# Patient Record
Sex: Female | Born: 1975 | Hispanic: Yes | Smoking: Never smoker
Health system: Southern US, Community
[De-identification: ages and names within clinical notes are randomized; demographics above are authoritative.]

## PROBLEM LIST (undated history)

## (undated) DIAGNOSIS — D649 Anemia, unspecified: Secondary | ICD-10-CM

## (undated) DIAGNOSIS — Z789 Other specified health status: Secondary | ICD-10-CM

## (undated) HISTORY — PX: NO PAST SURGERIES: SHX2092

---

## 2005-08-21 ENCOUNTER — Ambulatory Visit (HOSPITAL_COMMUNITY): Admission: RE | Admit: 2005-08-21 | Discharge: 2005-08-21 | Payer: Self-pay | Admitting: Internal Medicine

## 2005-11-09 ENCOUNTER — Ambulatory Visit: Payer: Self-pay | Admitting: Certified Nurse Midwife

## 2005-11-09 ENCOUNTER — Inpatient Hospital Stay (HOSPITAL_COMMUNITY): Admission: AD | Admit: 2005-11-09 | Discharge: 2005-11-10 | Payer: Self-pay | Admitting: Obstetrics & Gynecology

## 2005-11-20 ENCOUNTER — Inpatient Hospital Stay (HOSPITAL_COMMUNITY): Admission: AD | Admit: 2005-11-20 | Discharge: 2005-11-21 | Payer: Self-pay | Admitting: Gynecology

## 2006-02-25 ENCOUNTER — Inpatient Hospital Stay (HOSPITAL_COMMUNITY): Admission: AD | Admit: 2006-02-25 | Discharge: 2006-02-27 | Payer: Self-pay | Admitting: Obstetrics

## 2008-01-20 ENCOUNTER — Inpatient Hospital Stay (HOSPITAL_COMMUNITY): Admission: AD | Admit: 2008-01-20 | Discharge: 2008-01-22 | Payer: Self-pay | Admitting: Obstetrics

## 2008-04-22 IMAGING — US US OB COMP LESS 14 WK
1 series · 14 of 20 positions shown · non-contrast
Comparison: none

CLINICAL DATA: Uncertain menstrual dates.  Evaluate gestational age.  Stated LMP of 05/31/05.  
 OBSTETRICAL ULTRASOUND <14 WKS:
TECHNIQUE: Transabdominal ultrasound was performed for evaluation of the gestation as well as the maternal uterus and adnexal regions.

[Series 1: us ob comp less 14 wk · 0.29mm/px · 14 of 20 slices shown]
[im 1/20]
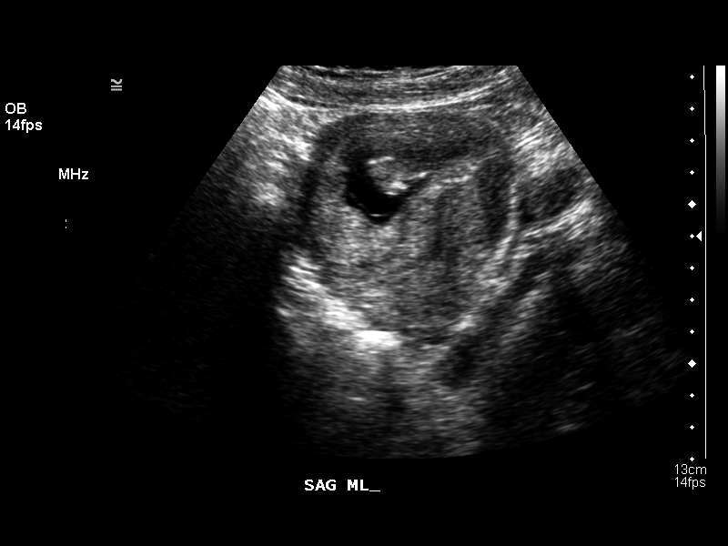
[im 3/20]
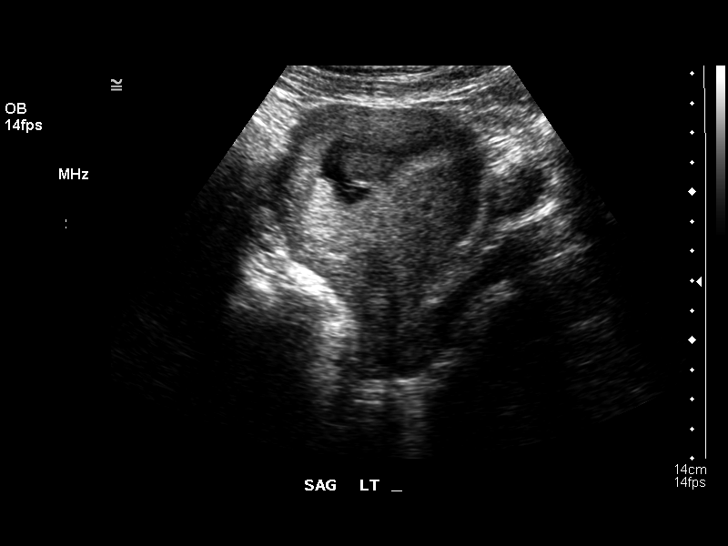
[im 4/20]
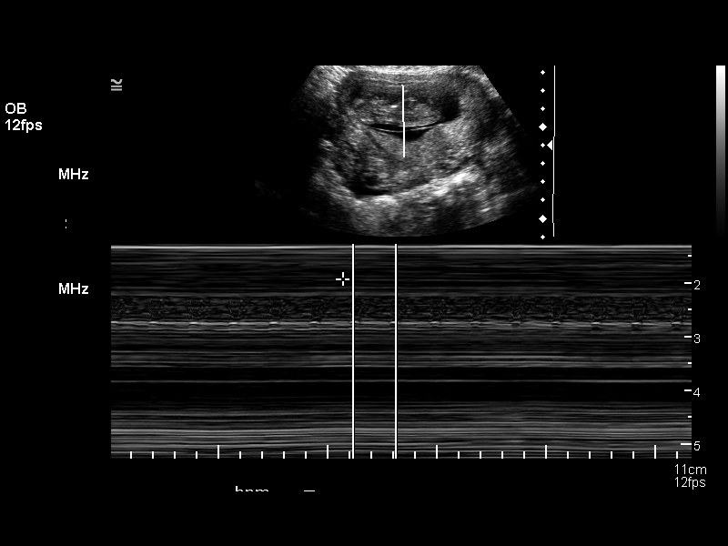
[im 6/20]
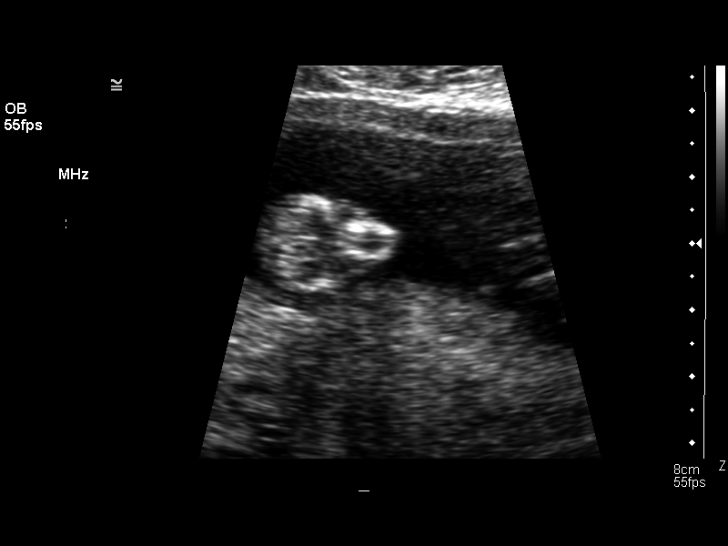
[im 7/20]
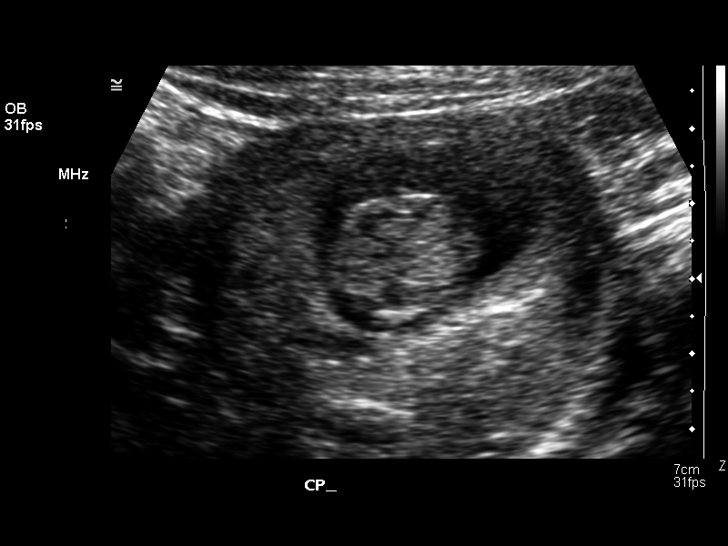
[im 8/20]
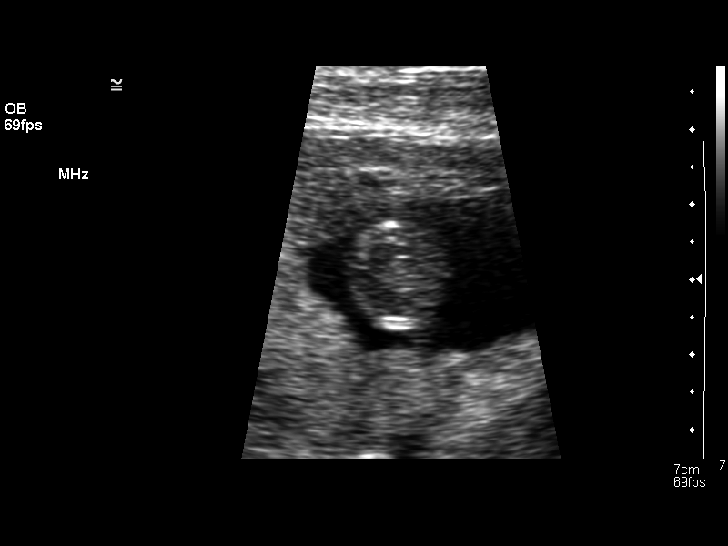
[im 10/20]
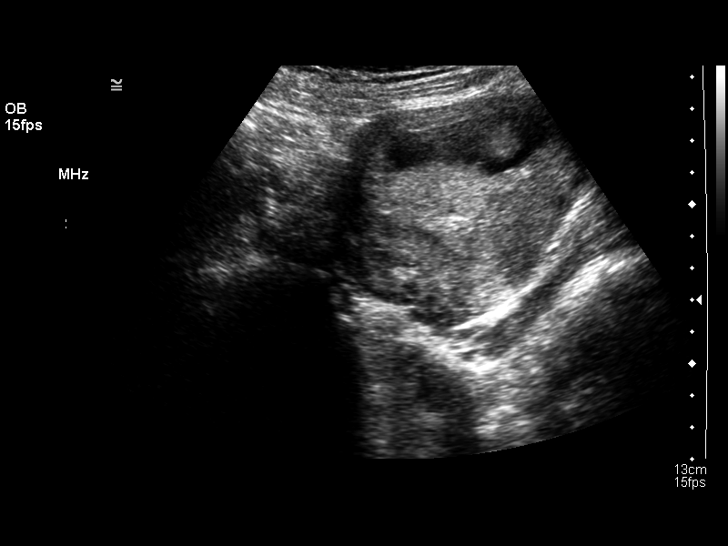
[im 11/20]
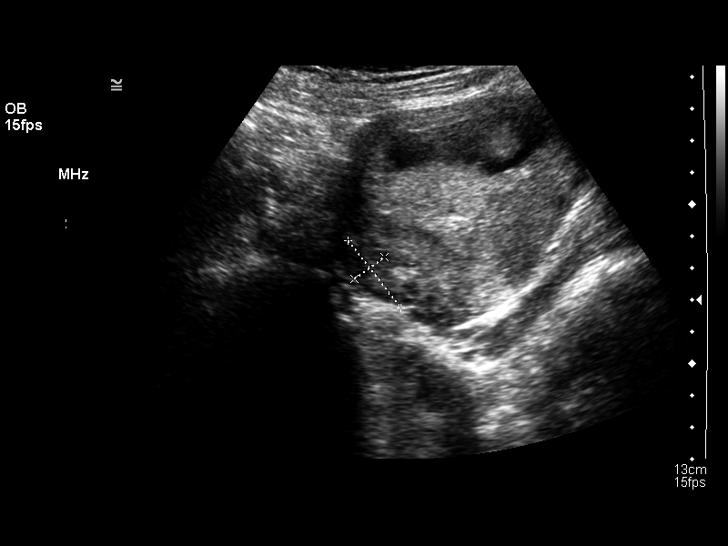
[im 13/20]
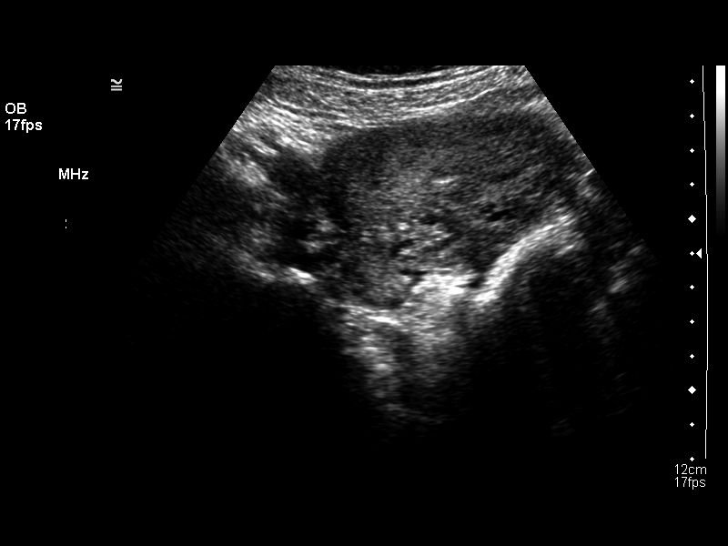
[im 14/20]
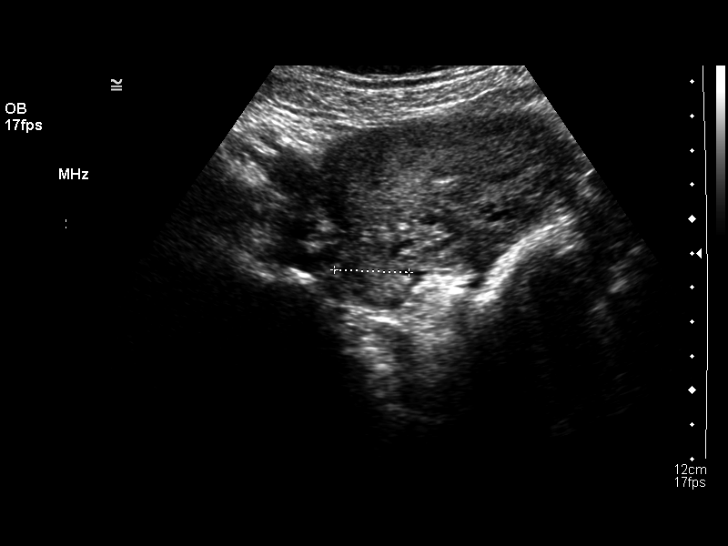
[im 16/20]
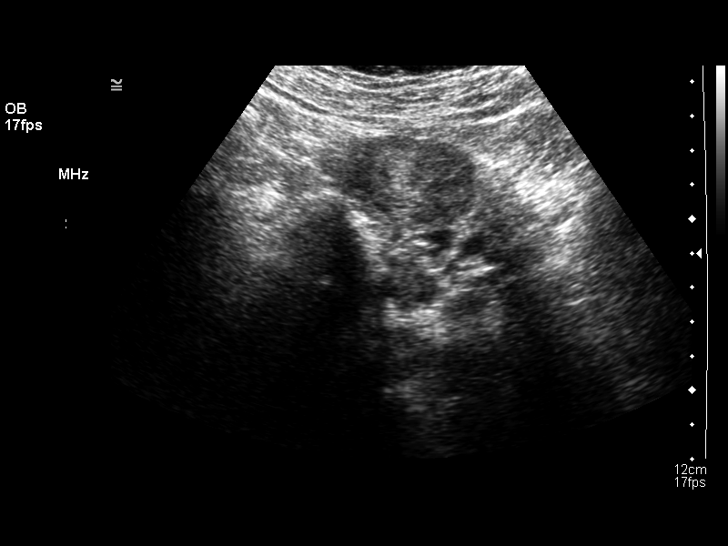
[im 17/20]
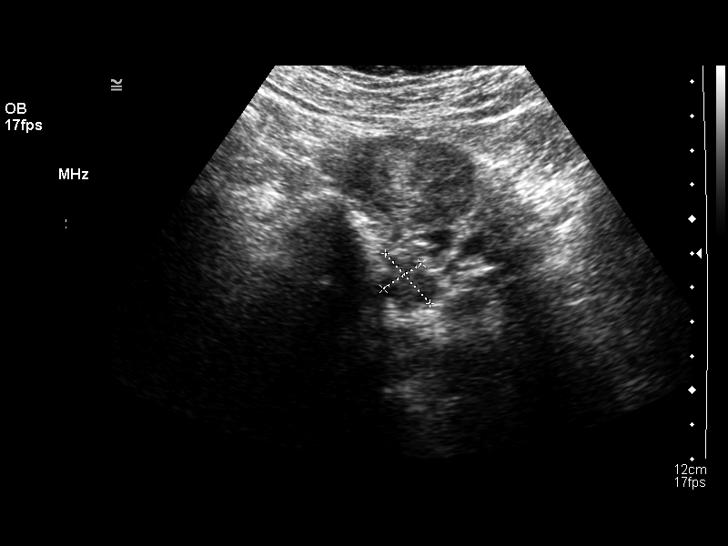
[im 18/20]
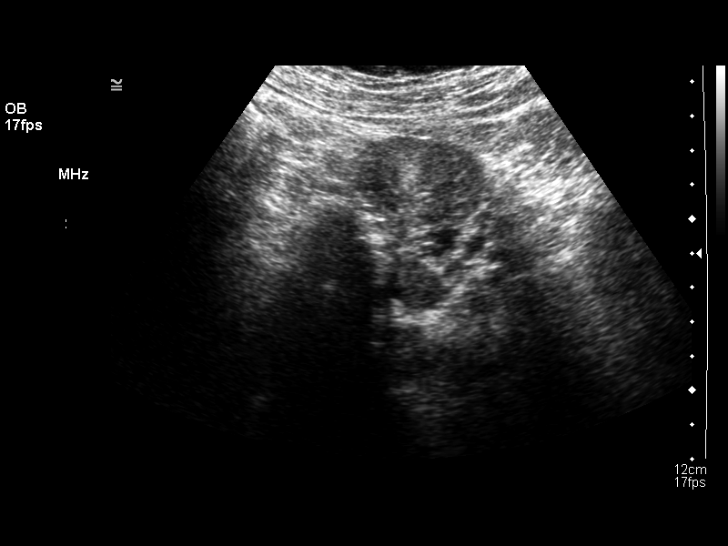
[im 20/20]
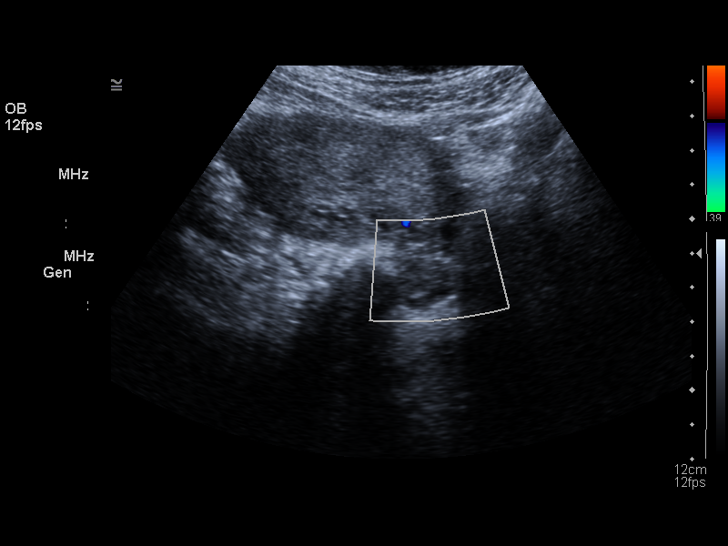

[14 of 20 positions shown; findings below may reference images not displayed]

FINDINGS: A single living intrauterine fetus is seen with measured heart rate of 153.  Fetal movement is noted.  Crown-rump length measures 5.0 cm, corresponding with a gestational age of 11 weeks 5 days.  Amniotic fluid volume appears normal.  There is no evidence of subchorionic hemorrhage.  No fibroids or other uterine abnormalities are identified.  
 Both ovaries are normal in appearance.  There is no evidence of adnexal mass or free fluid.
IMPRESSION: 1.  Single living intrauterine fetus with estimated gestational age of 11 weeks 5 days and sonographic EDC of 03/07/06.  This matches patient?s stated LMP.  
 2.  No maternal uterine or adnexal abnormality identified.

## 2010-02-02 ENCOUNTER — Encounter: Payer: Self-pay | Admitting: Internal Medicine

## 2010-04-28 LAB — CBC
HCT: 32.4 % — ABNORMAL LOW (ref 36.0–46.0)
HCT: 38.7 % (ref 36.0–46.0)
Hemoglobin: 11.1 g/dL — ABNORMAL LOW (ref 12.0–15.0)
MCHC: 34.3 g/dL (ref 30.0–36.0)
MCV: 90.9 fL (ref 78.0–100.0)
Platelets: 292 10*3/uL (ref 150–400)
RBC: 4.25 MIL/uL (ref 3.87–5.11)
RDW: 13.4 % (ref 11.5–15.5)
WBC: 14 10*3/uL — ABNORMAL HIGH (ref 4.0–10.5)

## 2011-06-11 ENCOUNTER — Ambulatory Visit: Payer: Self-pay | Admitting: Physician Assistant

## 2011-06-11 VITALS — BP 118/76 | HR 79 | Temp 98.5°F | Resp 18 | Ht 61.5 in | Wt 175.6 lb

## 2011-06-11 DIAGNOSIS — S61409A Unspecified open wound of unspecified hand, initial encounter: Secondary | ICD-10-CM

## 2011-06-11 DIAGNOSIS — Z23 Encounter for immunization: Secondary | ICD-10-CM

## 2011-06-11 DIAGNOSIS — M79609 Pain in unspecified limb: Secondary | ICD-10-CM

## 2011-06-11 DIAGNOSIS — M79643 Pain in unspecified hand: Secondary | ICD-10-CM

## 2011-06-11 MED ORDER — SULFAMETHOXAZOLE-TRIMETHOPRIM 800-160 MG PO TABS: 1.0000 | ORAL_TABLET | Freq: Two times a day (BID) | ORAL | Status: AC

## 2011-06-11 MED ORDER — HYDROCODONE-ACETAMINOPHEN 5-325 MG PO TABS: 1.0000 | ORAL_TABLET | Freq: Four times a day (QID) | ORAL | Status: AC | PRN

## 2011-06-11 NOTE — Patient Instructions (Signed)
[facility] (Wound Care) Vuelta en 2 dias para chequearse.  Por favor vuelta en 7 das para hacer sus puntadas/ grapas quitar o ms pronto si usted tiene preocupaciones. Shan Levans el rea limpia y seca por 24 horas. No quite el vendaje, si est aplicado. Marland Kitchen Despus de 24 horas, quita el vendaje y limpia la herida suavemente con cualquier tipo de [place] y agua tibia. Reaplique un vendaje nuevo despus de limpiar herida, si est dirigido. . Limpie la herida con el jabn y agua 1 a 2 veces al Holiday representative se quitan las puntadas. . No aplique ninguna ungentos ni cremas a la herida [employer] las puntadas estn en lugar, pues sta puede causar curativo retrasado. . Notifique la oficina si usted tiene el siguiente de los muestras de la infeccin: Hinchazn, enrojecimiento, calor, drenaje del pus, de la fiebre > 101.0 F . Notifique la oficina si usted tiene la sangra excesiva eso no para despus de 15-20 minutos de presin firme y Risk analyst.

## 2011-06-11 NOTE — Progress Notes (Signed)
Patient evaluated and Precepted with Ms. Marte, PA-C and agree.

## 2011-06-11 NOTE — Progress Notes (Signed)
  Subjective:    Patient ID: Sarah Baxter, female    DOB: 1975-02-13, 36 y.o.   MRN: [id number]  HPI Patient presents with puncture wound to left palm. States this morning when she was doing dishes she put her hand in the sink and a knife went into her hand. Admits to pain, swelling, and limited ROM. Tetanus status unknown.     Review of Systems  All other systems reviewed and are negative.       Objective:   Physical Exam  Constitutional: She appears well-developed.  HENT:  Head: Normocephalic and atraumatic.  Right Ear: External ear normal.  Left Ear: External ear normal.  Eyes: Conjunctivae are normal.  Musculoskeletal:       Right shoulder: She exhibits normal strength (normal strength of left flexors).  Skin: Skin is warm and dry.     Psychiatric: She has a normal mood and affect. Her behavior is normal. Judgment and thought content normal.     Verbal consent obtained from patient.  Local anesthesia with 3cc 2% lidocaine plain.  Wound scrubbed with soap and water and rinsed.  Wound closed with 2 4-0 Ethilon SI sutures.  Wound cleansed and dressed.      Assessment & Plan:   1. Need for Tdap vaccination  Tdap vaccine greater than or equal to 7yo IM  2. Pain, hand  HYDROcodone-acetaminophen (NORCO) 5-325 MG per tablet  3. Wound, open, hand with or without fingers  Follow up in 48 hours for recheck, sooner if any erythema, warmth, tenderness, or fevers sulfamethoxazole-trimethoprim (BACTRIM DS,SEPTRA DS) 800-160 MG per tablet

## 2011-06-13 ENCOUNTER — Ambulatory Visit (INDEPENDENT_AMBULATORY_CARE_PROVIDER_SITE_OTHER): Payer: Self-pay | Admitting: Family Medicine

## 2011-06-13 VITALS — BP 114/72 | HR 80 | Temp 98.9°F | Resp 16 | Ht 62.0 in | Wt 176.0 lb

## 2011-06-13 DIAGNOSIS — S61439A Puncture wound without foreign body of unspecified hand, initial encounter: Secondary | ICD-10-CM

## 2011-06-13 DIAGNOSIS — S61409A Unspecified open wound of unspecified hand, initial encounter: Secondary | ICD-10-CM

## 2011-06-13 NOTE — Patient Instructions (Addendum)
Recheck with Rhoderick Moody, PA-C. In 3 days between 9am and 5 pm., sooner if worse. --- advised pt while in office, but left prior to AVS printing.

## 2011-06-13 NOTE — Progress Notes (Signed)
  Subjective:    Patient ID: Sarah Baxter, female    DOB: 1975/02/08, 36 y.o.   MRN: [id number]  HPI Sarah Baxter is a 36 y.o. female  Seen 2 days ago for puncture wound to left palm  - when she was doing dishes she put her hand in the sink and a knife went into her hand.  DOI 06/11/11. wound closed with 2 4-0 Ethilon SI sutures. Tdap given , and started on septra ds.  No fever, no discharge, no increase in pain.  Tolerating antibiotic.  Review of Systems     Objective:   Physical Exam  L hand - palmar aspect with sutures intact.  No surrounding erythema.  Slight sts/fullness of L middle hand, but able to felex/extend phanges, wrist and pronate/supinate wrist without difficulty.        Assessment & Plan:  Puncture Wound - hand.   Stable, tolerating antibiotic.  Continue Septra, avoid repetitive use and recheck with Rhoderick Moody, PA-C in 3 days for status.  Anticipate s.r. In 5-8 days. RTC precautions discussed, and spanish spoken - understanding expressed.

## 2011-06-16 ENCOUNTER — Ambulatory Visit (INDEPENDENT_AMBULATORY_CARE_PROVIDER_SITE_OTHER): Payer: Self-pay | Admitting: Physician Assistant

## 2011-06-16 VITALS — BP 112/72 | HR 74 | Temp 98.7°F | Resp 18 | Wt 177.0 lb

## 2011-06-16 DIAGNOSIS — S61409A Unspecified open wound of unspecified hand, initial encounter: Secondary | ICD-10-CM

## 2011-06-16 NOTE — Progress Notes (Signed)
  Subjective:    Patient ID: Sarah Baxter, female    DOB: May 04, 1975, 36 y.o.   MRN: [id number]  HPI Patient here for suture removal. DOI: 06/11/11. Wound is well healing without erythema, drainage, or tenderness. Tolerating medication.     Review of Systems  All other systems reviewed and are negative.        Objective:   Physical Exam  Constitutional: She is oriented to person, place, and time. She appears well-developed and well-nourished.  HENT:  Head: Normocephalic and atraumatic.  Eyes: Conjunctivae are normal.  Neurological: She is alert and oriented to person, place, and time.  Skin:     Psychiatric: She has a normal mood and affect. Her behavior is normal. Judgment and thought content normal.    #2 sutures removed without difficulty.  Band-aid applied. Patient tolerated procedure well.       Assessment & Plan:  1. Wound, hand.   Follow up only if needed. Continue medication until completion.

## 2012-09-20 LAB — OB RESULTS CONSOLE RPR: RPR: NONREACTIVE

## 2012-09-20 LAB — OB RESULTS CONSOLE HEPATITIS B SURFACE ANTIGEN: HEP B S AG: NEGATIVE

## 2012-09-20 LAB — OB RESULTS CONSOLE GC/CHLAMYDIA
Chlamydia: NEGATIVE
GC PROBE AMP, GENITAL: NEGATIVE

## 2012-09-20 LAB — OB RESULTS CONSOLE ABO/RH: RH Type: POSITIVE

## 2012-09-20 LAB — OB RESULTS CONSOLE RUBELLA ANTIBODY, IGM: Rubella: IMMUNE

## 2012-09-20 LAB — OB RESULTS CONSOLE HIV ANTIBODY (ROUTINE TESTING): HIV: NONREACTIVE

## 2012-09-20 LAB — OB RESULTS CONSOLE ANTIBODY SCREEN: Antibody Screen: NEGATIVE

## 2013-01-12 NOTE — L&D Delivery Note (Signed)
Delivery Note At 6:45 AM a viable female was delivered via Vaginal, Spontaneous Delivery (Presentation: ;  ).  APGAR: 9, 9; weight .   Placenta status: Intact, Expressed.  Cord: 3 vessels with the following complications: None.  Cord pH: none  Anesthesia: Local  Episiotomy: None Lacerations: 2nd degree Suture Repair: 2.0 chromic Est. Blood Loss (mL): 350  Mom to postpartum.  Baby to Couplet care / Skin to Skin.  HARPER,CHARLES A 03/04/2013, 7:03 AM

## 2013-03-04 ENCOUNTER — Encounter (HOSPITAL_COMMUNITY): Payer: Self-pay | Admitting: *Deleted

## 2013-03-04 ENCOUNTER — Inpatient Hospital Stay (HOSPITAL_COMMUNITY)
Admission: AD | Admit: 2013-03-04 | Discharge: 2013-03-06 | DRG: 775 | Disposition: A | Discharge status: Home / Self Care | Payer: Medicaid Other | Source: Ambulatory Visit | Attending: Obstetrics | Admitting: Obstetrics

## 2013-03-04 DIAGNOSIS — O09529 Supervision of elderly multigravida, unspecified trimester: Secondary | ICD-10-CM | POA: Diagnosis present

## 2013-03-04 DIAGNOSIS — IMO0001 Reserved for inherently not codable concepts without codable children: Secondary | ICD-10-CM

## 2013-03-04 HISTORY — DX: Other specified health status: Z78.9

## 2013-03-04 LAB — ABO/RH: ABO/RH(D): O POS

## 2013-03-04 LAB — CBC
HCT: 37.8 % (ref 36.0–46.0)
HEMOGLOBIN: 13.4 g/dL (ref 12.0–15.0)
MCH: 30.1 pg (ref 26.0–34.0)
MCHC: 35.4 g/dL (ref 30.0–36.0)
MCV: 84.9 fL (ref 78.0–100.0)
Platelets: 269 10*3/uL (ref 150–400)
RBC: 4.45 MIL/uL (ref 3.87–5.11)
RDW: 14.1 % (ref 11.5–15.5)
WBC: 14.6 10*3/uL — ABNORMAL HIGH (ref 4.0–10.5)

## 2013-03-04 LAB — TYPE AND SCREEN
ABO/RH(D): O POS
Antibody Screen: NEGATIVE

## 2013-03-04 LAB — RPR: RPR Ser Ql: NONREACTIVE

## 2013-03-04 MED ORDER — OXYCODONE-ACETAMINOPHEN 5-325 MG PO TABS: 1.0000 | ORAL_TABLET | ORAL | Status: DC | PRN

## 2013-03-04 MED ORDER — SENNOSIDES-DOCUSATE SODIUM 8.6-50 MG PO TABS
2.0000 | ORAL_TABLET | ORAL | Status: DC
  Administered 2013-03-04 – 2013-03-05 (×2): 2 via ORAL
  Filled 2013-03-04 (×2): qty 2

## 2013-03-04 MED ORDER — PRENATAL MULTIVITAMIN CH
1.0000 | ORAL_TABLET | Freq: Every day | ORAL | Status: DC
  Administered 2013-03-04 – 2013-03-06 (×3): 1 via ORAL
  Filled 2013-03-04 (×3): qty 1

## 2013-03-04 MED ORDER — FLEET ENEMA 7-19 GM/118ML RE ENEM: 1.0000 | ENEMA | RECTAL | Status: DC | PRN

## 2013-03-04 MED ORDER — OXYCODONE-ACETAMINOPHEN 5-325 MG PO TABS
1.0000 | ORAL_TABLET | ORAL | Status: DC | PRN
  Administered 2013-03-04 – 2013-03-06 (×3): 1 via ORAL
  Filled 2013-03-04: qty 1
  Filled 2013-03-04: qty 2
  Filled 2013-03-04: qty 1

## 2013-03-04 MED ORDER — BENZOCAINE-MENTHOL 20-0.5 % EX AERO
1.0000 "application " | INHALATION_SPRAY | CUTANEOUS | Status: DC | PRN
  Filled 2013-03-04: qty 56

## 2013-03-04 MED ORDER — ACETAMINOPHEN 325 MG PO TABS: 650.0000 mg | ORAL_TABLET | ORAL | Status: DC | PRN

## 2013-03-04 MED ORDER — OXYTOCIN 40 UNITS IN LACTATED RINGERS INFUSION - SIMPLE MED: 62.5000 mL/h | INTRAVENOUS | Status: DC | PRN

## 2013-03-04 MED ORDER — LANOLIN HYDROUS EX OINT: TOPICAL_OINTMENT | CUTANEOUS | Status: DC | PRN

## 2013-03-04 MED ORDER — SIMETHICONE 80 MG PO CHEW: 80.0000 mg | CHEWABLE_TABLET | ORAL | Status: DC | PRN

## 2013-03-04 MED ORDER — ONDANSETRON HCL 4 MG/2ML IJ SOLN: 4.0000 mg | INTRAMUSCULAR | Status: DC | PRN

## 2013-03-04 MED ORDER — WITCH HAZEL-GLYCERIN EX PADS: 1.0000 "application " | MEDICATED_PAD | CUTANEOUS | Status: DC | PRN

## 2013-03-04 MED ORDER — ZOLPIDEM TARTRATE 5 MG PO TABS: 5.0000 mg | ORAL_TABLET | Freq: Every evening | ORAL | Status: DC | PRN

## 2013-03-04 MED ORDER — DIBUCAINE 1 % RE OINT: 1.0000 "application " | TOPICAL_OINTMENT | RECTAL | Status: DC | PRN

## 2013-03-04 MED ORDER — ONDANSETRON HCL 4 MG/2ML IJ SOLN: 4.0000 mg | Freq: Four times a day (QID) | INTRAMUSCULAR | Status: DC | PRN

## 2013-03-04 MED ORDER — OXYTOCIN 40 UNITS IN LACTATED RINGERS INFUSION - SIMPLE MED
62.5000 mL/h | INTRAVENOUS | Status: DC
  Filled 2013-03-04: qty 1000

## 2013-03-04 MED ORDER — TETANUS-DIPHTH-ACELL PERTUSSIS 5-2.5-18.5 LF-MCG/0.5 IM SUSP
0.5000 mL | Freq: Once | INTRAMUSCULAR | Status: AC
  Administered 2013-03-05: 0.5 mL via INTRAMUSCULAR

## 2013-03-04 MED ORDER — ONDANSETRON HCL 4 MG PO TABS: 4.0000 mg | ORAL_TABLET | ORAL | Status: DC | PRN

## 2013-03-04 MED ORDER — LIDOCAINE HCL (PF) 1 % IJ SOLN
30.0000 mL | INTRAMUSCULAR | Status: AC | PRN
  Administered 2013-03-04: 30 mL via SUBCUTANEOUS
  Filled 2013-03-04: qty 30

## 2013-03-04 MED ORDER — LACTATED RINGERS IV SOLN: 500.0000 mL | INTRAVENOUS | Status: DC | PRN

## 2013-03-04 MED ORDER — IBUPROFEN 600 MG PO TABS: 600.0000 mg | ORAL_TABLET | Freq: Four times a day (QID) | ORAL | Status: DC | PRN

## 2013-03-04 MED ORDER — MEDROXYPROGESTERONE ACETATE 150 MG/ML IM SUSP: 150.0000 mg | INTRAMUSCULAR | Status: DC | PRN

## 2013-03-04 MED ORDER — LACTATED RINGERS IV SOLN
INTRAVENOUS | Status: DC
  Administered 2013-03-04: 06:00:00 via INTRAVENOUS

## 2013-03-04 MED ORDER — CITRIC ACID-SODIUM CITRATE 334-500 MG/5ML PO SOLN: 30.0000 mL | ORAL | Status: DC | PRN

## 2013-03-04 MED ORDER — DIPHENHYDRAMINE HCL 25 MG PO CAPS: 25.0000 mg | ORAL_CAPSULE | Freq: Four times a day (QID) | ORAL | Status: DC | PRN

## 2013-03-04 MED ORDER — SODIUM CHLORIDE 0.9 % IV SOLN
2.0000 g | Freq: Once | INTRAVENOUS | Status: AC
  Administered 2013-03-04: 2 g via INTRAVENOUS
  Filled 2013-03-04: qty 2000

## 2013-03-04 MED ORDER — IBUPROFEN 600 MG PO TABS
600.0000 mg | ORAL_TABLET | Freq: Four times a day (QID) | ORAL | Status: DC
  Administered 2013-03-04 – 2013-03-06 (×9): 600 mg via ORAL
  Filled 2013-03-04 (×9): qty 1

## 2013-03-04 MED ORDER — OXYTOCIN BOLUS FROM INFUSION
500.0000 mL | INTRAVENOUS | Status: DC
  Administered 2013-03-04: 500 mL via INTRAVENOUS

## 2013-03-04 NOTE — MAU Note (Signed)
Patient presents with complaints of contractions and pressure. SVE 9.5/100/BBOW. FHTs 135. Dr. Clearance CootsHarper notified.  [facility]

## 2013-03-04 NOTE — H&P (Signed)
Sarah Baxter is a 38 y.o. female presenting for UC's. Maternal Medical History:  Reason for admission: Contractions.  38 yo G4 P3.  EDC 03-12-13.  Presents with UC's.  Fetal activity: Perceived fetal activity is normal.   Last perceived fetal movement was within the past hour.    Prenatal complications: no prenatal complications Prenatal Complications - Diabetes: none.    OB History   Grav Para Term Preterm Abortions TAB SAB Ect Mult Living   1              Past Medical History  Diagnosis Date  . Medical history non-contributory    Past Surgical History  Procedure Laterality Date  . No past surgeries     Family History: family history is not on file. Social History:  reports that she has never smoked. She does not have any smokeless tobacco history on file. She reports that she does not drink alcohol or use illicit drugs.   Prenatal Transfer Tool  Maternal Diabetes: No Genetic Screening: Declined Maternal Ultrasounds/Referrals: Normal Fetal Ultrasounds or other Referrals:  None Maternal Substance Abuse:  No Significant Maternal Medications:  None Significant Maternal Lab Results:  None Other Comments:  AMA.  Review of Systems  All other systems reviewed and are negative.    Dilation: Lip/rim Effacement (%): 100 Exam by:: L. Munford RN Blood pressure 152/81, pulse 79, temperature 98.4 F (36.9 C), temperature source Oral. Maternal Exam:  Uterine Assessment: Contraction strength is firm.  Abdomen: Patient reports no abdominal tenderness. Fetal presentation: vertex  Introitus: Normal vulva. Normal vagina.  Pelvis: adequate for delivery.   Cervix: Cervix evaluated by digital exam.     Physical Exam  Nursing note and vitals reviewed. Constitutional: She is oriented to person, place, and time. She appears well-developed and well-nourished.  HENT:  Head: Normocephalic and atraumatic.  Eyes: Conjunctivae are normal. Pupils are equal, round, and reactive to  light.  Neck: Normal range of motion. Neck supple.  Cardiovascular: Normal rate and regular rhythm.   Respiratory: Effort normal and breath sounds normal.  GI: Soft.  Genitourinary: Vagina normal and uterus normal.  Musculoskeletal: Normal range of motion.  Neurological: She is alert and oriented to person, place, and time.  Skin: Skin is warm and dry.  Psychiatric: She has a normal mood and affect. Her behavior is normal. Judgment and thought content normal.    Prenatal labs: ABO, Rh: O/Positive/-- (09/09 0000) Antibody: Negative (09/09 0000) Rubella: Immune (09/09 0000) RPR: Nonreactive (09/09 0000)  HBsAg: Negative (09/09 0000)  HIV: Non-reactive (09/09 0000)  GBS:     Assessment/Plan: 38.6 weeks.  Active labor.  Admit.   Latreshia Beauchaine A 03/04/2013, 6:14 AM

## 2013-03-04 NOTE — Progress Notes (Signed)
Sarah Baxter is a 38 y.o. [phone] at 3780w6d by LMP admitted for active labor  Subjective:   Objective: BP 152/81  Pulse 79  Temp(Src) 98.4 F (36.9 C) (Oral)  Ht 5\' 1"  (1.549 m)  Wt 186 lb (84.369 kg)  BMI 35.16 kg/m2      FHT:  FHR: 120-130 bpm, variability: moderate,  accelerations:  Present,  decelerations:  Absent UC:   regular, every 2-3 minutes SVE:   Dilation: Lip/rim Effacement (%): 100 Exam by:: L. Munford RN  Labs: Lab Results  Component Value Date   WBC 14.6* 03/04/2013   HGB 13.4 03/04/2013   HCT 37.8 03/04/2013   MCV 84.9 03/04/2013   PLT 269 03/04/2013    Assessment / Plan: Spontaneous labor, progressing normally  Labor: Progressing normally Preeclampsia:  n/a Fetal Wellbeing:  Category I Pain Control:  Labor support without medications I/D:  n/a Anticipated MOD:  NSVD  Sarah Baxter A 03/04/2013, 6:31 AM

## 2013-03-05 LAB — CBC
HEMATOCRIT: 33.6 % — AB (ref 36.0–46.0)
Hemoglobin: 11.5 g/dL — ABNORMAL LOW (ref 12.0–15.0)
MCH: 29.6 pg (ref 26.0–34.0)
MCHC: 34.2 g/dL (ref 30.0–36.0)
MCV: 86.6 fL (ref 78.0–100.0)
Platelets: 227 10*3/uL (ref 150–400)
RBC: 3.88 MIL/uL (ref 3.87–5.11)
RDW: 14.8 % (ref 11.5–15.5)
WBC: 13.5 10*3/uL — ABNORMAL HIGH (ref 4.0–10.5)

## 2013-03-05 NOTE — Progress Notes (Signed)
Post Partum Day 1 Subjective: no complaints  Objective: Blood pressure 118/71, pulse 71, temperature 97.7 F (36.5 C), temperature source Oral, resp. rate 18, height 5\' 1"  (1.549 m), weight 186 lb (84.369 kg), SpO2 100.00%, unknown if currently breastfeeding.  Physical Exam:  General: alert and no distress Lochia: appropriate Uterine Fundus: firm Incision: healing well DVT Evaluation: No evidence of DVT seen on physical exam.   Recent Labs  03/04/13 0600 03/05/13 0620  HGB 13.4 11.5*  HCT 37.8 33.6*    Assessment/Plan: Plan for discharge tomorrow   LOS: 1 day   HARPER,CHARLES A 03/05/2013, 8:18 AM

## 2013-03-05 NOTE — Lactation Note (Signed)
This note was copied from the chart of Sarah Baxter. Lactation Consultation Note Follow up consult:  Baby 27 hours old and sleeping.  Recently had 30 ml of gerber formula at 915 am.  Interpreter and Bethann Berkshirerisha RN present.  Mother states she has no milk.  Suggested she call with next feeding for assistance with hand expression and latching baby.  Mother has states to [place] RN that she is getting sore.  Reviewed supply and demand, feeding cues, and deep wide latch with mother.  Encouraged mother to call for   Patient Name: Sarah Baxter ZOXWR'UToday's Date: 03/05/2013     Maternal Data    Feeding    LATCH Score/Interventions                      Lactation Tools Discussed/Used     Consult Status Consult Status: Follow-up Date: 03/05/13 Follow-up type: In-patient    Dahlia ByesBerkelhammer, Ruth [facility] 03/05/2013, 10:13 AM

## 2013-03-06 NOTE — Progress Notes (Signed)
UR chart review completed.  

## 2013-03-06 NOTE — Lactation Note (Signed)
This note was copied from the chart of Sarah Baxter. Lactation Consultation Note: Follow up visit with mom before DC. [name]- Spanish translator present. Mom reports that baby latches well but she has no milk so she has been giving formula. Encouraged to always BF first then give formula after nursing if baby is still hungry. Experienced BF mom No questions at present. To call prn  Patient Name: Sarah Baxter EXBMW'UToday's Date: 03/06/2013 Reason for consult: Follow-up assessment   Maternal Data Formula Feeding for Exclusion: Yes Reason for exclusion: Mother's choice to formula and breast feed on admission  Feeding    LATCH Score/Interventions                      Lactation Tools Discussed/Used     Consult Status Consult Status: Complete    Pamelia HoitWeeks, [name] D 03/06/2013, 11:45 AM

## 2013-03-06 NOTE — Discharge Instructions (Signed)
Discharge instructions   You can wash your hair  Shower  Eat what you want  Drink what you want  See me in 6 weeks  Your ankles are going to swell more in the next 2 weeks than when pregnant  No sex for 6 weeks   Carliss Porcaro A, MD 03/06/2013

## 2013-03-06 NOTE — Discharge Summary (Signed)
Obstetric Discharge Summary Reason for Admission: onset of labor Prenatal Procedures: none Intrapartum Procedures: spontaneous vaginal delivery Postpartum Procedures: none Complications-Operative and Postpartum: none Hemoglobin  Date Value Ref Range Status  03/05/2013 11.5* 12.0 - 15.0 g/dL Final     HCT  Date Value Ref Range Status  03/05/2013 33.6* 36.0 - 46.0 % Final    Physical Exam:  General: alert Lochia: appropriate Uterine Fundus: firm Incision: healing well DVT Evaluation: No evidence of DVT seen on physical exam.  Discharge Diagnoses: Term Pregnancy-delivered  Discharge Information: Date: 03/06/2013 Activity: pelvic rest Diet: routine Medications: Percocet Condition: stable Instructions: refer to practice specific booklet Discharge to: home Follow-up Information   Follow up with Kathreen CosierMARSHALL,Deyton Ellenbecker A, MD.   Specialty:  Obstetrics and Gynecology   Contact information:   [address] [place] [id number] [phone]       Newborn Data: Live born female  Birth Weight: 7 lb 6.4 oz (3357 g) APGAR: 9, 9  Home with mother.  Kariyah Baugh A 03/06/2013, 6:45 AM

## 2013-11-13 ENCOUNTER — Encounter (HOSPITAL_COMMUNITY): Payer: Self-pay | Admitting: *Deleted

## 2014-01-12 NOTE — L&D Delivery Note (Signed)
Delivery Note At 12:00 PM a viable female was delivered via  (Presentation: ;  ).  APGAR: , ; weight  .   Placenta status: , .  Cord:  with the following complications: .  Cord pH: not done  Anesthesia:   Episiotomy:   Lacerations:   Suture Repair: 2.0 Est. Blood Loss (mL):    Mom to postpartum.  Baby to Couplet care / Skin to Skin.  MARSHALL,BERNARD A 08/23/2014, 1:18 PM

## 2014-04-11 ENCOUNTER — Ambulatory Visit (INDEPENDENT_AMBULATORY_CARE_PROVIDER_SITE_OTHER): Payer: Medicaid Other | Admitting: General Practice

## 2014-04-11 ENCOUNTER — Encounter: Payer: Self-pay | Admitting: General Practice

## 2014-04-11 DIAGNOSIS — Z3201 Encounter for pregnancy test, result positive: Secondary | ICD-10-CM

## 2014-04-11 LAB — POCT PREGNANCY, URINE: PREG TEST UR: POSITIVE — AB

## 2014-04-11 NOTE — Progress Notes (Signed)
[employer] interpreter [phone]#[id number] used for encounter. Patient here for pregnancy test today, test positive. This is her first pregnancy test. Reports LMP 12/05/13 EDD 09/11/14. Patient 4640w1d. Patient states she just needs a pregnancy verification letter for adopt a mom. Letter given to patient. Patient had no uestions

## 2014-05-03 LAB — CYTOLOGY - PAP: PAP SMEAR: NEGATIVE

## 2014-06-21 ENCOUNTER — Other Ambulatory Visit (HOSPITAL_COMMUNITY): Payer: Self-pay | Admitting: Obstetrics

## 2014-06-21 DIAGNOSIS — O283 Abnormal ultrasonic finding on antenatal screening of mother: Secondary | ICD-10-CM

## 2014-06-29 ENCOUNTER — Ambulatory Visit (HOSPITAL_COMMUNITY)
Admission: RE | Admit: 2014-06-29 | Discharge: 2014-06-29 | Disposition: A | Discharge status: Home / Self Care | Payer: Self-pay | Source: Ambulatory Visit | Attending: Obstetrics | Admitting: Obstetrics

## 2014-06-29 ENCOUNTER — Encounter (HOSPITAL_COMMUNITY): Payer: Self-pay

## 2014-06-29 ENCOUNTER — Ambulatory Visit (HOSPITAL_COMMUNITY): Admission: RE | Admit: 2014-06-29 | Payer: Self-pay | Source: Ambulatory Visit

## 2014-06-29 DIAGNOSIS — O09523 Supervision of elderly multigravida, third trimester: Secondary | ICD-10-CM | POA: Insufficient documentation

## 2014-06-29 DIAGNOSIS — Z36 Encounter for antenatal screening of mother: Secondary | ICD-10-CM | POA: Insufficient documentation

## 2014-06-29 DIAGNOSIS — Z3A29 29 weeks gestation of pregnancy: Secondary | ICD-10-CM | POA: Insufficient documentation

## 2014-06-29 DIAGNOSIS — O09529 Supervision of elderly multigravida, unspecified trimester: Secondary | ICD-10-CM | POA: Insufficient documentation

## 2014-06-29 DIAGNOSIS — O4403 Placenta previa specified as without hemorrhage, third trimester: Secondary | ICD-10-CM | POA: Insufficient documentation

## 2014-06-29 DIAGNOSIS — O283 Abnormal ultrasonic finding on antenatal screening of mother: Secondary | ICD-10-CM

## 2014-06-29 DIAGNOSIS — Z3689 Encounter for other specified antenatal screening: Secondary | ICD-10-CM | POA: Insufficient documentation

## 2014-08-09 ENCOUNTER — Other Ambulatory Visit (HOSPITAL_COMMUNITY): Payer: Self-pay | Admitting: Obstetrics

## 2014-08-23 ENCOUNTER — Encounter (HOSPITAL_COMMUNITY): Payer: Self-pay

## 2014-08-23 ENCOUNTER — Inpatient Hospital Stay (HOSPITAL_COMMUNITY)
Admission: AD | Admit: 2014-08-23 | Discharge: 2014-08-24 | DRG: 775 | Disposition: A | Discharge status: Home / Self Care | Payer: Medicaid Other | Source: Ambulatory Visit | Attending: Obstetrics | Admitting: Obstetrics

## 2014-08-23 DIAGNOSIS — Z3A37 37 weeks gestation of pregnancy: Secondary | ICD-10-CM | POA: Diagnosis present

## 2014-08-23 DIAGNOSIS — IMO0001 Reserved for inherently not codable concepts without codable children: Secondary | ICD-10-CM

## 2014-08-23 HISTORY — DX: Anemia, unspecified: D64.9

## 2014-08-23 LAB — CBC
HEMATOCRIT: 37.7 % (ref 36.0–46.0)
HEMOGLOBIN: 13 g/dL (ref 12.0–15.0)
MCH: 30.3 pg (ref 26.0–34.0)
MCHC: 34.5 g/dL (ref 30.0–36.0)
MCV: 87.9 fL (ref 78.0–100.0)
Platelets: 267 10*3/uL (ref 150–400)
RBC: 4.29 MIL/uL (ref 3.87–5.11)
RDW: 14.6 % (ref 11.5–15.5)
WBC: 12.6 10*3/uL — ABNORMAL HIGH (ref 4.0–10.5)

## 2014-08-23 LAB — TYPE AND SCREEN
ABO/RH(D): O POS
ANTIBODY SCREEN: NEGATIVE

## 2014-08-23 MED ORDER — SENNOSIDES-DOCUSATE SODIUM 8.6-50 MG PO TABS
2.0000 | ORAL_TABLET | ORAL | Status: DC
  Administered 2014-08-24: 2 via ORAL
  Filled 2014-08-23: qty 2

## 2014-08-23 MED ORDER — FERROUS SULFATE 325 (65 FE) MG PO TABS
325.0000 mg | ORAL_TABLET | Freq: Two times a day (BID) | ORAL | Status: DC
  Administered 2014-08-23 – 2014-08-24 (×3): 325 mg via ORAL
  Filled 2014-08-23 (×3): qty 1

## 2014-08-23 MED ORDER — OXYCODONE-ACETAMINOPHEN 5-325 MG PO TABS
2.0000 | ORAL_TABLET | ORAL | Status: DC | PRN
  Administered 2014-08-23: 2 via ORAL
  Filled 2014-08-23: qty 2

## 2014-08-23 MED ORDER — ONDANSETRON HCL 4 MG/2ML IJ SOLN: 4.0000 mg | INTRAMUSCULAR | Status: DC | PRN

## 2014-08-23 MED ORDER — ONDANSETRON HCL 4 MG PO TABS: 4.0000 mg | ORAL_TABLET | ORAL | Status: DC | PRN

## 2014-08-23 MED ORDER — EPHEDRINE 5 MG/ML INJ
10.0000 mg | INTRAVENOUS | Status: DC | PRN
  Filled 2014-08-23: qty 2

## 2014-08-23 MED ORDER — LACTATED RINGERS IV SOLN
INTRAVENOUS | Status: DC
Start: 2014-08-23 — End: 2014-08-23

## 2014-08-23 MED ORDER — IBUPROFEN 600 MG PO TABS
600.0000 mg | ORAL_TABLET | Freq: Four times a day (QID) | ORAL | Status: DC
  Administered 2014-08-23 – 2014-08-24 (×5): 600 mg via ORAL
  Filled 2014-08-23 (×5): qty 1

## 2014-08-23 MED ORDER — LIDOCAINE HCL (PF) 1 % IJ SOLN
30.0000 mL | INTRAMUSCULAR | Status: DC | PRN
  Filled 2014-08-23: qty 30

## 2014-08-23 MED ORDER — OXYCODONE-ACETAMINOPHEN 5-325 MG PO TABS: 2.0000 | ORAL_TABLET | ORAL | Status: DC | PRN

## 2014-08-23 MED ORDER — FLEET ENEMA 7-19 GM/118ML RE ENEM: 1.0000 | ENEMA | RECTAL | Status: DC | PRN

## 2014-08-23 MED ORDER — OXYCODONE-ACETAMINOPHEN 5-325 MG PO TABS: 1.0000 | ORAL_TABLET | ORAL | Status: DC | PRN

## 2014-08-23 MED ORDER — SIMETHICONE 80 MG PO CHEW: 80.0000 mg | CHEWABLE_TABLET | ORAL | Status: DC | PRN

## 2014-08-23 MED ORDER — TETANUS-DIPHTH-ACELL PERTUSSIS 5-2.5-18.5 LF-MCG/0.5 IM SUSP
0.5000 mL | Freq: Once | INTRAMUSCULAR | Status: AC
  Administered 2014-08-24: 0.5 mL via INTRAMUSCULAR
  Filled 2014-08-23: qty 0.5

## 2014-08-23 MED ORDER — ZOLPIDEM TARTRATE 5 MG PO TABS: 5.0000 mg | ORAL_TABLET | Freq: Every evening | ORAL | Status: DC | PRN

## 2014-08-23 MED ORDER — DIPHENHYDRAMINE HCL 50 MG/ML IJ SOLN
12.5000 mg | INTRAMUSCULAR | Status: DC | PRN
Start: 2014-08-23 — End: 2014-08-24

## 2014-08-23 MED ORDER — ACETAMINOPHEN 325 MG PO TABS: 650.0000 mg | ORAL_TABLET | ORAL | Status: DC | PRN

## 2014-08-23 MED ORDER — BUTORPHANOL TARTRATE 1 MG/ML IJ SOLN
1.0000 mg | INTRAMUSCULAR | Status: DC | PRN
  Filled 2014-08-23: qty 1

## 2014-08-23 MED ORDER — CITRIC ACID-SODIUM CITRATE 334-500 MG/5ML PO SOLN
30.0000 mL | ORAL | Status: DC | PRN
Start: 2014-08-23 — End: 2014-08-23

## 2014-08-23 MED ORDER — ONDANSETRON HCL 4 MG/2ML IJ SOLN: 4.0000 mg | Freq: Four times a day (QID) | INTRAMUSCULAR | Status: DC | PRN

## 2014-08-23 MED ORDER — DIBUCAINE 1 % RE OINT: 1.0000 "application " | TOPICAL_OINTMENT | RECTAL | Status: DC | PRN

## 2014-08-23 MED ORDER — PHENYLEPHRINE 40 MCG/ML (10ML) SYRINGE FOR IV PUSH (FOR BLOOD PRESSURE SUPPORT)
80.0000 ug | PREFILLED_SYRINGE | INTRAVENOUS | Status: DC | PRN
  Filled 2014-08-23: qty 2

## 2014-08-23 MED ORDER — OXYTOCIN 40 UNITS IN LACTATED RINGERS INFUSION - SIMPLE MED
62.5000 mL/h | INTRAVENOUS | Status: DC
  Filled 2014-08-23: qty 1000

## 2014-08-23 MED ORDER — LACTATED RINGERS IV SOLN: 500.0000 mL | INTRAVENOUS | Status: DC | PRN

## 2014-08-23 MED ORDER — FENTANYL 2.5 MCG/ML BUPIVACAINE 1/10 % EPIDURAL INFUSION (WH - ANES): 14.0000 mL/h | INTRAMUSCULAR | Status: DC | PRN

## 2014-08-23 MED ORDER — LANOLIN HYDROUS EX OINT: TOPICAL_OINTMENT | CUTANEOUS | Status: DC | PRN

## 2014-08-23 MED ORDER — PRENATAL MULTIVITAMIN CH
1.0000 | ORAL_TABLET | Freq: Every day | ORAL | Status: DC
Start: 2014-08-24 — End: 2014-08-24
  Administered 2014-08-24: 1 via ORAL
  Filled 2014-08-23: qty 1

## 2014-08-23 MED ORDER — WITCH HAZEL-GLYCERIN EX PADS: 1.0000 "application " | MEDICATED_PAD | CUTANEOUS | Status: DC | PRN

## 2014-08-23 MED ORDER — BENZOCAINE-MENTHOL 20-0.5 % EX AERO: 1.0000 "application " | INHALATION_SPRAY | CUTANEOUS | Status: DC | PRN

## 2014-08-23 MED ORDER — DIPHENHYDRAMINE HCL 25 MG PO CAPS: 25.0000 mg | ORAL_CAPSULE | Freq: Four times a day (QID) | ORAL | Status: DC | PRN

## 2014-08-23 MED ORDER — OXYTOCIN BOLUS FROM INFUSION
500.0000 mL | INTRAVENOUS | Status: DC
  Administered 2014-08-23: 500 mL via INTRAVENOUS

## 2014-08-23 NOTE — MAU Note (Signed)
Patient first noticed contractions this morning at 330 with mucus/blood.

## 2014-08-23 NOTE — MAU Note (Signed)
Notified Dr. Gaynell Face patient G5P4 history of previous previa resolved, cervix 5/80/-2 vertex, per Dr. Gaynell Face admit GBS negative.

## 2014-08-23 NOTE — H&P (Signed)
This is Dr. Francoise Ceo dictating the history and physical on  Sarah Baxter  she is a 39 year old gravida 5 para 404 at 37 weeks 2 days EDC 8:30 negative GBS admitted in labor she is now 6-7 cm 80% vertex -2 amniotomy performed fluids clear she is contracting irregularly Past medical history negative Past surgical history negative Social history negative System review negative Physical exam well-developed female in labor HEENT negative Lungs clear to P&A Heart regular rhythm no murmurs or gallops Breasts negative Abdomen her Pelvic as described above Extremities negative

## 2014-08-24 LAB — CBC
HEMATOCRIT: 34.3 % — AB (ref 36.0–46.0)
Hemoglobin: 11.6 g/dL — ABNORMAL LOW (ref 12.0–15.0)
MCH: 30.1 pg (ref 26.0–34.0)
MCHC: 33.8 g/dL (ref 30.0–36.0)
MCV: 88.9 fL (ref 78.0–100.0)
Platelets: 235 10*3/uL (ref 150–400)
RBC: 3.86 MIL/uL — AB (ref 3.87–5.11)
RDW: 14.9 % (ref 11.5–15.5)
WBC: 12.4 10*3/uL — AB (ref 4.0–10.5)

## 2014-08-24 LAB — RPR: RPR Ser Ql: NONREACTIVE

## 2014-08-24 NOTE — Discharge Instructions (Signed)
Discharge instructions ° °· You can wash your hair °· Shower °· Eat what you want °· Drink what you want °· See me in 6 weeks °· Your ankles are going to swell more in the next 2 weeks than when pregnant °· No sex for 6 weeks ° ° °Rishard Delange A, MD 08/24/2014 ° ° °

## 2014-08-24 NOTE — Progress Notes (Signed)
Patient ID: Sarah Baxter, female   DOB: [birth year], 39 y.o.   MRN: [id number] Postpartum day one Blood pressure 02/09/1950 respiration 18 pulse 80 patient wants early discharge home today

## 2014-08-24 NOTE — Lactation Note (Signed)
This note was copied from the chart of Sarah Shanon Seawright. Lactation Consultation Note  Experience BF mother is concerned that she does have enough milk.  Explained size of baby's stomach to mom and taught hand expression with large amounts of colostrum visible. Assisted her with and off-center latch and she reported increased comfort. Supply and demand explained as was appropriate output.  Encouraged her to always BF first.  Handouts given and explained to mom for outpatient services.  Denies any questions at this time. Patient Name: Sarah Baxter [id number]'X Date: 08/24/2014 Reason for consult: Initial assessment   Maternal Data    Feeding Feeding Type: Breast Fed  LATCH Score/Interventions Latch: Grasps breast easily, tongue down, lips flanged, rhythmical sucking.  Audible Swallowing: A few with stimulation  Type of Nipple: Everted at rest and after stimulation  Comfort (Breast/Nipple): Soft / non-tender     Hold (Positioning): Assistance needed to correctly position infant at breast and maintain latch.  LATCH Score: 8  Lactation Tools Discussed/Used     Consult Status Consult Status: Complete    Soyla Dryer 08/24/2014, 9:48 AM

## 2014-08-24 NOTE — Discharge Summary (Signed)
Obstetric Discharge Summary Reason for Admission: onset of labor Prenatal Procedures: none Intrapartum Procedures: spontaneous vaginal delivery Postpartum Procedures: none Complications-Operative and Postpartum: none HEMOGLOBIN  Date Value Ref Range Status  08/23/2014 13.0 12.0 - 15.0 g/dL Final   HCT  Date Value Ref Range Status  08/23/2014 37.7 36.0 - 46.0 % Final    Physical Exam:  General: alert Lochia: appropriate Uterine Fundus: firm Incision: healing well DVT Evaluation: No evidence of DVT seen on physical exam.  Discharge Diagnoses: Term Pregnancy-delivered  Discharge Information: Date: 08/24/2014 Activity: pelvic rest Diet: routine Medications: Percocet Condition: improved Instructions: refer to practice specific booklet Discharge to: home   Newborn Data: Live born female  Birth Weight: 7 lb 6.2 oz (3350 g) APGAR: 9, 9  Home with mother.  Felton Buczynski A 08/24/2014, 4:59 AM

## 2014-08-24 NOTE — Progress Notes (Signed)
Patient has an iv  Sl.  Not charted

## 2014-08-24 NOTE — Progress Notes (Signed)
I stopped to check on patient's need I ordered her meals, by Viria Alvarez Spanish Interpreter °

## 2014-08-25 ENCOUNTER — Ambulatory Visit: Payer: Self-pay

## 2014-08-25 NOTE — Progress Notes (Signed)
Checked on patients needs. Ordered patients dinner. °Spanish Interpreter  °

## 2014-08-25 NOTE — Lactation Note (Addendum)
This note was copied from the chart of Sarah Baxter. Lactation Consultation Note  Follow up visit made.  Baby was started on phototherapy this AM.  Mom reports short feedings at breast.  Supplementing with formula.  DEBP set up at bedside and will initiate once interpreter present.  Patient Name: Sarah Baxter [id number] Date: 08/25/2014     Maternal Data    Feeding Feeding Type: Breast Fed Length of feed: 6 min  LATCH Score/Interventions                      Lactation Tools Discussed/Used     Consult Status      Sarah Baxter 08/25/2014, 1:05 PM

## 2014-08-25 NOTE — Lactation Note (Signed)
This note was copied from the chart of Sarah Baxter. Lactation Consultation Note  Follow up visit made with interpreter present to instruct patient on DEBP and feeding plan.  Mom states baby has been very sleepy since phototherapy initiated this AM.  Mom's breasts are full and becoming uncomfortable.  Assisted with pumping for 15 minutes and 20 mls of transitional milk obtained.  Mom gave expressed milk to baby per bottle and baby tolerated well.  Plan is to put baby to breast with any feeding cue, post pump every 3 hours x 15 minutes and give expressed milk back to baby per bottle.  Questions answered and mom states she understands plan.  Encouraged to call out with questions or concerns.  Patient Name: Sarah Baxter ZOXWR'U Date: 08/25/2014 Reason for consult: Follow-up assessment;Hyperbilirubinemia   Maternal Data    Feeding Feeding Type: Bottle Fed - Breast Milk Length of feed: 6 min  LATCH Score/Interventions                      Lactation Tools Discussed/Used Pump Review: Setup, frequency, and cleaning;Milk Storage Initiated by:: LC Date initiated:: 08/25/14   Consult Status Consult Status: Follow-up Date: 08/26/14 Follow-up type: In-patient    [name] 08/25/2014, 3:01 PM

## 2014-08-25 NOTE — Progress Notes (Signed)
Checked on patients needs. Patient had ate Research officer, trade [employer]

## 2014-08-25 NOTE — Progress Notes (Signed)
Checked on patients needs.  °Spanish Interpreter  °

## 2014-08-26 ENCOUNTER — Ambulatory Visit: Payer: Self-pay

## 2014-08-26 NOTE — Lactation Note (Signed)
This note was copied from the chart of Sarah Emmelina Mcloughlin. Lactation Consultation Note  Follow up assessment. Mom's breasts are full but not engorged this AM.  She has been putting baby to breast first and then supplementing with expressed milk.  Observed mom independently latch baby to breast with a deep latch.  Baby nursed actively with many audible swallows.  Will follow up with interpreter at discharge.  Patient Name: Sarah Baxter ZOXWR'U Date: 08/26/2014 Reason for consult: Follow-up assessment;Hyperbilirubinemia   Maternal Data    Feeding Feeding Type: Breast Fed  LATCH Score/Interventions Latch: Grasps breast easily, tongue down, lips flanged, rhythmical sucking.  Audible Swallowing: Spontaneous and intermittent Intervention(s): Alternate breast massage;Hand expression;Skin to skin  Type of Nipple: Everted at rest and after stimulation  Comfort (Breast/Nipple): Soft / non-tender     Hold (Positioning): No assistance needed to correctly position infant at breast. Intervention(s): Breastfeeding basics reviewed;Support Pillows  LATCH Score: 10  Lactation Tools Discussed/Used     Consult Status      [name] 08/26/2014, 11:10 AM

## 2014-08-26 NOTE — Lactation Note (Signed)
This note was copied from the chart of [name]. Lactation Consultation Note  Manual pump given with instructions on use if needed for comfort or if baby doesn't feed well.  Reviewed breast milk storage guidelines.  Instructed to feed baby with cues and pump and supplement if baby has a poor feeding.  Patient Name: [name] Date: 08/26/2014     Maternal Data    Feeding Feeding Type: Breast Fed  LATCH Score/Interventions                      Lactation Tools Discussed/Used     Consult Status      [name] 08/26/2014, 3:38 PM

## 2014-08-26 NOTE — Progress Notes (Signed)
Assisted RN with interpretation with discharge instructions.  Spanish Interpreter

## 2014-08-26 NOTE — Progress Notes (Signed)
Assisted RN with Interpretation of discharge instructions.  Spanish interpreter

## 2016-03-10 ENCOUNTER — Ambulatory Visit (INDEPENDENT_AMBULATORY_CARE_PROVIDER_SITE_OTHER): Payer: Self-pay | Admitting: Family Medicine

## 2016-03-10 VITALS — BP 138/78 | HR 89 | Temp 99.0°F | Resp 16 | Ht 62.0 in | Wt 187.4 lb

## 2016-03-10 DIAGNOSIS — L237 Allergic contact dermatitis due to plants, except food: Secondary | ICD-10-CM

## 2016-03-10 MED ORDER — TRIAMCINOLONE ACETONIDE 0.1 % EX CREA: 1.0000 "application " | TOPICAL_CREAM | Freq: Four times a day (QID) | CUTANEOUS | 1 refills | Status: AC

## 2016-03-10 MED ORDER — PREDNISONE 20 MG PO TABS: ORAL_TABLET | ORAL | 0 refills | Status: AC

## 2016-03-10 NOTE — Progress Notes (Signed)
By signing my name below, I, Mesha Guinyard, attest that this documentation has been prepared under the direction and in the presence of Norberto Sorenson, MD.  Electronically Signed: Arvilla Market, Medical Scribe. 03/10/16. 11:07 AM.  Subjective:    Patient ID: Sarah Baxter, female    DOB: 1975/09/25, 41 y.o.   MRN: [id number]  HPI Chief Complaint  Patient presents with  . Rash    itchy, weeping, started Thursday  all over     HPI Comments: Sarah Baxter is a 41 y.o. female who presents to the [facility] complaining of itchy rash onset 5 days ago.   Pt's native language is Spanish and [place] interpreter, [phone], was used.   Pt was clearing the brush and weeds from her yard when her rash developed. She reports associated sxs of clear yellow draining fluid, edema in the concerned areas, chills, and trouble swallowing - suspects trouble swallowing is from her tonsils. Pt has used OTC cream without relief of her sxs. She has been eating and drinking regularly. Denies fevers, nausea, emesis, and throat swelling.   Patient Active Problem List   Diagnosis Date Noted  . Active labor at term 08/23/2014  . NVD (normal vaginal delivery) 08/23/2014  . [redacted] weeks gestation of pregnancy   . Encounter for fetal anatomic survey   . Placenta previa antepartum in third trimester   . Maternal age 76+, multigravida, antepartum   . Active labor 03/04/2013  . Normal delivery 03/04/2013   Past Medical History:  Diagnosis Date  . Anemia   . Medical history non-contributory    Past Surgical History:  Procedure Laterality Date  . NO PAST SURGERIES     No Known Allergies Prior to Admission medications   Not on File   Social History   Social History  . Marital status: Unknown    Spouse name: N/A  . Number of children: N/A  . Years of education: N/A   Occupational History  . Not on file.   Social History Main Topics  . Smoking status: Never Smoker  .  Smokeless tobacco: Never Used  . Alcohol use No  . Drug use: No  . Sexual activity: Yes    Birth control/ protection: None   Other Topics Concern  . Not on file   Social History Narrative  . No narrative on file   Review of Systems  Constitutional: Positive for chills. Negative for fever.  HENT: Positive for trouble swallowing.   Gastrointestinal: Negative for nausea and vomiting.  Skin: Positive for color change and rash. Negative for pallor.   Objective:  Physical Exam  Constitutional: She appears well-developed and well-nourished. No distress.  HENT:  Head: Normocephalic and atraumatic.  Mouth/Throat: Oropharynx is clear and moist.  Eyes: Conjunctivae are normal.  Neck: Neck supple.  Cardiovascular: Normal rate.   Murmur heard.  Systolic (ejection, RUSB) murmur is present  Pulmonary/Chest: Effort normal and breath sounds normal. No respiratory distress. She has no decreased breath sounds. She has no wheezes. She has no rhonchi. She has no rales.  Neurological: She is alert.  Skin: Skin is warm and dry. Rash noted.  Rash over: entire neck, behind left pinna, bilateral forearms, inguinal areas, behind each popliteal fossa, between chest and between breast No rash on her back  Psychiatric: She has a normal mood and affect. Her behavior is normal.  Nursing note and vitals reviewed.  BP 138/78 (BP Location: Right Arm, Patient Position: Sitting, Cuff Size: Large)  Pulse 89   Temp 99 F (37.2 C) (Oral)   Resp 16   Ht 5\' 2"  (1.575 m)   Wt 187 lb 6.4 oz (85 kg)   LMP 02/14/2016   SpO2 99%   BMI 34.28 kg/m  Assessment & Plan:   1. Poison ivy dermatitis      Meds ordered this encounter  Medications  . predniSONE (DELTASONE) 20 MG tablet    Sig: Take 3 tabs qd x 3d, then 2 tabs qd x 3d then 1 tab qd x 3d.    Dispense:  18 tablet    Refill:  0    Instructions in spanish please.  . triamcinolone cream (KENALOG) 0.1 %    Sig: Apply 1 application topically 4 (four)  times daily. Prn poison ivy rash    Dispense:  80 g    Refill:  1    Instructions in Spanish please    I personally performed the services described in this documentation, which was scribed in my presence. The recorded information has been reviewed and considered, and addended by me as needed.   Norberto SorensonEva Hargun Spurling, M.D.  [facility]  [place] [address] [place], [place] [address] [phone] phone [phone]) [phone] fax  03/17/16 1:19 PM

## 2016-03-10 NOTE — Patient Instructions (Addendum)
IF you received an x-ray today, you will receive an invoice from [facility]. Please contact [facility] at [phone] with questions or concerns regarding your invoice.   IF you received labwork today, you will receive an invoice from [facility]. Please contact [facility] at [phone] with questions or concerns regarding your invoice.   Our billing staff will not be able to assist you with questions regarding bills from these companies.  You will be contacted with the lab results as soon as they are available. The fastest way to get your results is to activate your My Chart account. Instructions are located on the last page of this paperwork. If you have not heard from [place] regarding the results in 2 weeks, please contact this office.      Dermatitis por hiedra venenosa (Poison Ivy Dermatitis) La dermatitis por hiedra venenosa es la inflamacin de la piel que se produce por los alrgenos de las hojas de dicha planta. La reaccin de la piel a menudo incluye enrojecimiento, hinchazn, ampollas y Financial risk analyst. CAUSAS Esta afeccin se produce por una sustancia qumica especfica (urushiol) que se encuentra en la savia de la hiedra venenosa. La sustancia qumica es pegajosa y puede transmitirse fcilmente a personas, animales y [place]. Puede contraer dermatitis por hiedra venenosa en cualquiera de estos casos:  Tiene contacto directo con una planta de hiedra venenosa.  Toca animales, personas u objetos que han estado en contacto con la hiedra venenosa y en los que ha quedado la sustancia qumica. FACTORES DE RIESGO Es ms probable que esta afeccin se manifieste en:  Personas que suelen estar al [employer].  Personas que estn al [employer] sin ropa de proteccin, como calzado cerrado, pantalones largos y camisa de Data processing manager. SNTOMAS Los sntomas de esta afeccin incluyen lo siguiente:  Enrojecimiento y picazn.  Una erupcin cutnea con bultos y  ampollas. Generalmente, la erupcin cutnea aparece 48horas despus de la exposicin.  Hinchazn. Puede ocurrir si la reaccin es ms grave. Por lo general, los sntomas duran de 1 a 2semanas. Sin embargo, la primera vez que la afeccin aparece, los sntomas pueden durar entre 3 y [place]. DIAGNSTICO Esta afeccin se puede diagnosticar en funcin de los sntomas y de un examen fsico. El mdico tambin puede hacerle preguntas sobre cualquier actividad reciente al [place]. TRATAMIENTO El tratamiento de esta afeccin variar en funcin de la gravedad. El tratamiento puede incluir lo siguiente:  Cremas con hidrocortisona o lociones con calamina para Associate Professor.  Baos de avena para calmar la piel.  Antihistamnicos en comprimidos de venta libre.  Corticoides por va oral para tratar las erupciones ms graves. INSTRUCCIONES PARA EL CUIDADO EN EL HOGAR  Tome o aplquese los medicamentos de venta libre y Building control surveyor como se lo haya indicado el mdico.  Lave la piel que haya estado expuesta con agua fra y jabn lo antes posible.  Utilice cremas con hidrocortisona o una locin con calamina para calmar la piel y Associate Professor, tanto y como sea necesario.  Tome baos de avena segn sea necesario. Use avena coloidal. Puede conseguirla en la tienda de comestibles o la farmacia local. Siga las instrucciones del envase.  No se rasque ni se refriegue la piel.  Mientras tenga erupcin cutnea, lave las prendas que use inmediatamente despus de sacrselas. PREVENCIN  Aprenda a identificar la hiedra venenosa y evite el contacto con la planta. Esta planta puede reconocerse por la cantidad de hojas. En general, la hiedra venenosa  tiene tres hojas, con ramas que florecen de un solo tallo. Las hojas suelen ser brillantes y tienen bordes irregulares que terminan en una punta en el frente.  Si ha estado expuesto a la hiedra venenosa, lvese muy bien con agua y [place] de  inmediato. Tiene alrededor de 30minutos para eliminar la resina de la planta antes de que le cause una erupcin cutnea. Asegrese de lavarse debajo de las uas de las [place], porque todo resto de resina seguir diseminando la erupcin cutnea.  Al hacer excursiones o ir de campamento, use ropa que lo ayude a evitar la exposicin de la piel. Esto incluye pantalones largos, camisa de Northeast Utilitiesmangas largas, medias altas y botas para caminar. Tambin puede aplicarse una locin preventiva en la piel, que lo ayudar a limitar la exposicin.  Si sospecha que su ropa o el equipo especfico para el aire libre entraron en contacto con una hiedra venenosa, enjuguelos con una manguera de jardn antes de llevarlos a su casa. SOLICITE ATENCIN MDICA SI:  Tiene lceras abiertas en la zona de la erupcin cutnea.  Tiene ms enrojecimiento, hinchazn o dolor en la zona afectada.  Tiene enrojecimiento que se extiende ms all de la zona de la erupcin cutnea.  Emana lquido, sangre o pus de la zona afectada.  Tiene fiebre.  Tiene una erupcin cutnea en una zona extensa del cuerpo.  Tiene una erupcin cutnea en los ojos, la boca o los genitales.  La erupcin cutnea no mejora despus de [employer] Mutual of Omahaunos das. SOLICITE ATENCIN MDICA DE INMEDIATO SI:  Se le hincha la cara o se le hinchan los prpados al punto de cerrarse.  Tiene dificultad para respirar.  Presenta dificultad para tragar. Esta informacin no tiene Theme park managercomo fin reemplazar el consejo del mdico. Asegrese de hacerle al mdico cualquier pregunta que tenga. Document Released: 10/08/2004 Document Revised: 04/22/2015 Document Reviewed: 06/06/2014 Elsevier Interactive Patient Education  2017 [employer]
